# Patient Record
Sex: Male | Born: 1965 | Race: White | Hispanic: No | Marital: Single | Smoking: Current every day smoker
Health system: Southern US, Community
[De-identification: ages and names within clinical notes are randomized; demographics above are authoritative.]

---

## 2014-12-09 ENCOUNTER — Encounter (HOSPITAL_BASED_OUTPATIENT_CLINIC_OR_DEPARTMENT_OTHER): Payer: Self-pay | Admitting: *Deleted

## 2014-12-09 ENCOUNTER — Emergency Department (HOSPITAL_BASED_OUTPATIENT_CLINIC_OR_DEPARTMENT_OTHER): Payer: Self-pay

## 2014-12-09 ENCOUNTER — Emergency Department (HOSPITAL_BASED_OUTPATIENT_CLINIC_OR_DEPARTMENT_OTHER)
Admission: EM | Admit: 2014-12-09 | Discharge: 2014-12-09 | Disposition: A | Discharge status: Home / Self Care | Payer: Self-pay | Attending: Emergency Medicine | Admitting: Emergency Medicine

## 2014-12-09 DIAGNOSIS — W11XXXA Fall on and from ladder, initial encounter: Secondary | ICD-10-CM | POA: Insufficient documentation

## 2014-12-09 DIAGNOSIS — S4992XA Unspecified injury of left shoulder and upper arm, initial encounter: Secondary | ICD-10-CM | POA: Insufficient documentation

## 2014-12-09 DIAGNOSIS — Y998 Other external cause status: Secondary | ICD-10-CM | POA: Insufficient documentation

## 2014-12-09 DIAGNOSIS — S62612A Displaced fracture of proximal phalanx of right middle finger, initial encounter for closed fracture: Secondary | ICD-10-CM | POA: Insufficient documentation

## 2014-12-09 DIAGNOSIS — Z720 Tobacco use: Secondary | ICD-10-CM | POA: Insufficient documentation

## 2014-12-09 DIAGNOSIS — Y92094 Garage of other non-institutional residence as the place of occurrence of the external cause: Secondary | ICD-10-CM | POA: Insufficient documentation

## 2014-12-09 DIAGNOSIS — S62609A Fracture of unspecified phalanx of unspecified finger, initial encounter for closed fracture: Secondary | ICD-10-CM

## 2014-12-09 DIAGNOSIS — M79643 Pain in unspecified hand: Secondary | ICD-10-CM

## 2014-12-09 DIAGNOSIS — S59902A Unspecified injury of left elbow, initial encounter: Secondary | ICD-10-CM | POA: Insufficient documentation

## 2014-12-09 DIAGNOSIS — Y9389 Activity, other specified: Secondary | ICD-10-CM | POA: Insufficient documentation

## 2014-12-09 DIAGNOSIS — M25522 Pain in left elbow: Secondary | ICD-10-CM

## 2014-12-09 DIAGNOSIS — M25512 Pain in left shoulder: Secondary | ICD-10-CM

## 2014-12-09 MED ORDER — HYDROCODONE-ACETAMINOPHEN 5-325 MG PO TABS: 1.0000 | ORAL_TABLET | Freq: Four times a day (QID) | ORAL | Status: DC | PRN

## 2014-12-09 MED ORDER — HYDROMORPHONE HCL 1 MG/ML IJ SOLN
1.0000 mg | Freq: Once | INTRAMUSCULAR | Status: AC
  Administered 2014-12-09: 1 mg via INTRAVENOUS
  Filled 2014-12-09: qty 1

## 2014-12-09 MED ORDER — ONDANSETRON HCL 4 MG/2ML IJ SOLN
4.0000 mg | Freq: Once | INTRAMUSCULAR | Status: AC
  Administered 2014-12-09: 4 mg via INTRAVENOUS
  Filled 2014-12-09: qty 2

## 2014-12-09 MED ORDER — KETOROLAC TROMETHAMINE 30 MG/ML IJ SOLN
30.0000 mg | Freq: Once | INTRAMUSCULAR | Status: AC
  Administered 2014-12-09: 30 mg via INTRAVENOUS
  Filled 2014-12-09: qty 1

## 2014-12-09 MED ORDER — TRAMADOL HCL 50 MG PO TABS: 50.0000 mg | ORAL_TABLET | Freq: Four times a day (QID) | ORAL | Status: AC | PRN

## 2014-12-09 MED ORDER — IBUPROFEN 600 MG PO TABS: 600.0000 mg | ORAL_TABLET | Freq: Three times a day (TID) | ORAL | Status: AC | PRN

## 2014-12-09 NOTE — ED Notes (Signed)
Pt reports that he fell off a ladder while working.  Reports that his right hand was crushed in a garage door motor.  Reports (L) shoulder, left rib, left elbow pain.  Obvious left shoulder deformity.  Pulses and sensation present.  Pt in obvious pain.

## 2014-12-09 NOTE — ED Notes (Signed)
Pt stated he just remembered that Vicodin gave him a rash but he can take Percocet. Dr. Patria Maneampos notified and prescription for Tramadol given. Pt states Tramadol will not help him.

## 2014-12-09 NOTE — ED Provider Notes (Signed)
CSN: [id number]     Arrival date & time 12/09/14  1317 History   First MD Initiated Contact with Patient 12/09/14 1337     Chief Complaint  Patient presents with  . Fall     The history is provided by the patient.   Patient was working on a ladder on a garage door when the motor component of the garage door fell and pinned his right hand between the motor and the latter.  This causes exquisite pain and when he pulled his right hand out he fell off a ladder onto his left shoulder and presents with severe left shoulder and left elbow pain.  He denies head injury.  Denies neck pain or back pain.  Denies weakness of his upper lower extremities.  He reports pain to the MCP joint of his right middle finger.  He also reports discomfort and pain in his left shoulder and his left elbow.  He denies numbness or weakness of his hands.  No abdominal pain.  Pain is moderate in severity.  Pain is worsened by movement and palpation of his injured extremities.  Denies headache.  No use of anticoagulants.   History reviewed. No pertinent past medical history. History reviewed. No pertinent past surgical history. History reviewed. No pertinent family history. History  Substance Use Topics  . Smoking status: Current Every Day Smoker -- 1.00 packs/day    Types: Cigarettes  . Smokeless tobacco: Not on file  . Alcohol Use: No    Review of Systems  All other systems reviewed and are negative.     Allergies  Review of patient's allergies indicates no known allergies.  Home Medications   Prior to Admission medications   Medication Sig Start Date End Date Taking? Authorizing Provider  HYDROcodone-acetaminophen (NORCO/VICODIN) 5-325 MG per tablet Take 1 tablet by mouth every 6 (six) hours as needed for moderate pain. 12/09/14   Azalia BilisKevin Angelita Harnack, MD  ibuprofen (ADVIL,MOTRIN) 600 MG tablet Take 1 tablet (600 mg total) by mouth every 8 (eight) hours as needed. 12/09/14   Azalia BilisKevin Broady Lafoy, MD   BP 145/111 mmHg  Pulse  102  Temp(Src) 97.8 F (36.6 C) (Oral)  Resp 22  Ht 6\' 2"  (1.88 m)  Wt 280 lb (127.007 kg)  BMI 35.93 kg/m2  SpO2 98% Physical Exam  Constitutional: He is oriented to person, place, and time. He appears well-developed and well-nourished.  HENT:  Head: Normocephalic and atraumatic.  Eyes: EOM are normal.  Neck: Normal range of motion.  Cardiovascular: Normal rate, regular rhythm, normal heart sounds and intact distal pulses.   Pulmonary/Chest: Effort normal and breath sounds normal. No respiratory distress. He exhibits no tenderness.  Abdominal: Soft. He exhibits no distension. There is no tenderness.  Musculoskeletal: Normal range of motion.  Patient with tenderness and mild swelling of his MCP joint of his right third finger.  Compartments of his right hand are soft.  Full range of motion of right wrist, right elbow, right shoulder.  Patient with mild tenderness of his left before meals joint without obvious deformity.  Some pain with range of motion of his left shoulder.  Patient also with tenderness in palpation along his olecranon of his left.  Full range of motion of left hand and left wrist.  Normal left radial pulse.  Left upper extremities perfused.  Neurological: He is alert and oriented to person, place, and time.  Skin: Skin is warm and dry.  Psychiatric: He has a normal mood and affect. Judgment normal.  Nursing note and vitals reviewed.   ED Course  Procedures (including critical care time) Labs Review Labs Reviewed - No data to display  Imaging Review Dg Elbow Complete Left  12/09/2014   CLINICAL DATA:  Larey Seat off a ladder today.  Left elbow pain.  EXAM: LEFT ELBOW - COMPLETE 3+ VIEW  COMPARISON:  None.  FINDINGS: The joint spaces are maintained. No acute bony findings, significant degenerative changes or joint effusion.  IMPRESSION: No acute fracture.   Electronically Signed   By: Rudie Meyer M.D.   On: 12/09/2014 14:16   Dg Shoulder Left  12/09/2014   CLINICAL DATA:   Pain after fall from ladder  EXAM: LEFT SHOULDER - 2+ VIEW  COMPARISON:  None.  FINDINGS: Frontal and Y scapular images were obtained. There is evidence of old trauma involving the distal clavicle with what appears to be chronic widening of the acromioclavicular joint. There is also widening of the coracoclavicular joint consistent with a degree of coracoclavicular separation, age uncertain. No acute fracture or dislocation. No appreciable joint space narrowing.  IMPRESSION: Old trauma involving the distal right clavicle with widening of the acromioclavicular joint. Age uncertain coracoclavicular separation is apparent. No acute fracture or frank dislocation.   Electronically Signed   By: Bretta Bang III M.D.   On: 12/09/2014 14:16   Dg Hand Complete Right  12/09/2014   CLINICAL DATA:  Larey Seat off ladder this morning with right hand pain  EXAM: RIGHT HAND - COMPLETE 3+ VIEW  COMPARISON:  None.  FINDINGS: There is a chip fracture off of the ulnar side base of the third proximal phalanx. No other focal abnormalities.  IMPRESSION: Fracture third proximal phalanx   Electronically Signed   By: Esperanza Heir M.D.   On: 12/09/2014 14:17  I personally reviewed the imaging tests through PACS system I reviewed available ER/hospitalization records through the EMR    EKG Interpretation None      MDM   Final diagnoses:  Hand pain  Left shoulder pain  Left elbow pain  Finger fracture, left, closed, initial encounter    Possible before meals joint separation.  His pain is localized to this area.  Small punctate proximal phalanx fracture.  Treated with buddy taping.  Orthopedic follow-up for his left shoulder if his pain is left shoulder does not improve.  Home with a sling and buddy taping.  Home with hydrocodone.  Repeat abdominal exam benign.  Compartments of right hand are soft.  Patient reports that the motor weight approximately 30 pounds.    Azalia Bilis, MD 12/09/14 1515

## 2014-12-09 NOTE — Discharge Instructions (Signed)
Buddy Taping You have a minor finger or toe injury. It can be managed by buddy taping. Buddy taping means the injured finger or toe is taped to a healthy uninjured adjacent finger or toe. Most minor fractures and dislocations of the smaller fingers and toes will heal in 3 to 4 weeks. Buddy taping immobilizes and protects the area of injury. Buddy taping is not recommended for initial treatment of fractures of the thumb, longer fingers, or the great toe. Buddy taping should not be used for unstable or deformed fractures, but as fracture healing progresses it may be used for protection during rehabilitation. Fractured fingers and toes should be protected by buddy taping as long as the injury is still painful or swollen.  When an injury is buddy taped, place a small piece of gauze or cotton between the digits that are taped. This helps prevent the skin from breaking down from increased moisture. Buddy taping allows you to get your injury wet when you bathe. Change the gauze and tape more often if it gets wet, and dry the space between the finger or toes. Use a sturdy, hard-soled shoe for better support if you have a fractured toe. In 2 to 3 weeks you can start motion exercises. This will keep the fingers or toes from becoming stiff.  SEEK IMMEDIATE MEDICAL CARE IF:   The injured area becomes cold, numb, or pale.  You have pain not controlled with medications.  You notice increasing deformity of the toe or finger. Document Released: 08/12/2004 Document Revised: 09/27/2011 Document Reviewed: 12/11/2008 [facility] Patient Information 2015 [place], [place]. This information is not intended to replace advice given to you by your health care provider. Make sure you discuss any questions you have with your health care provider. Acromioclavicular Injuries The AC (acromioclavicular) joint is the joint in the shoulder where the collarbone (clavicle) meets the shoulder blade (scapula). The part of the shoulder blade  connected to the collarbone is called the acromion. Common problems with and treatments for the [facility] joint are detailed below. ARTHRITIS Arthritis occurs when the joint has been injured and the smooth padding between the joints (cartilage) is lost. This is the wear and tear seen in most joints of the body if they have been overused. This causes the joint to produce pain and swelling which is worse with activity.  AC JOINT SEPARATION AC joint separation means that the ligaments connecting the acromion of the shoulder blade and collarbone have been damaged, and the two bones no longer line up. AC separations can be anywhere from mild to severe, and are "graded" depending upon which ligaments are torn and how badly they are torn.  Grade I Injury: the least damage is done, and the [facility] joint still lines up.  Grade II Injury: damage to the ligaments which reinforce the [facility] joint. In a Grade II injury, these ligaments are stretched but not entirely torn. When stressed, the [facility] joint becomes painful and unstable.  Grade III Injury: AC and secondary ligaments are completely torn, and the collarbone is no longer attached to the shoulder blade. This results in deformity; a prominence of the end of the clavicle. AC JOINT FRACTURE AC joint fracture means that there has been a break in the bones of the [facility] joint, usually the end of the clavicle. TREATMENT TREATMENT OF AC ARTHRITIS  There is currently no way to replace the cartilage damaged by arthritis. The best way to improve the condition is to decrease the activities which aggravate the problem.  Application of ice to the joint helps decrease pain and soreness (inflammation). The use of non-steroidal anti-inflammatory medication is helpful.  If less conservative measures do not work, then cortisone shots (injections) may be used. These are anti-inflammatories; they decrease the soreness in the joint and swelling.  If non-surgical measures fail, surgery may be  recommended. The procedure is generally removal of a portion of the end of the clavicle. This is the part of the collarbone closest to your acromion which is stabilized with ligaments to the acromion of the shoulder blade. This surgery may be performed using a tube-like instrument with a light (arthroscope) for looking into a joint. It may also be performed as an open surgery through a small incision by the surgeon. Most patients will have good range of motion within 6 weeks and may return to all activity including sports by 8-12 weeks, barring complications. TREATMENT OF AN AC SEPARATION  The initial treatment is to decrease pain. This is best accomplished by immobilizing the arm in a sling and placing an ice pack to the shoulder for 20 to 30 minutes every 2 hours as needed. As the pain starts to subside, it is important to begin moving the fingers, wrist, elbow and eventually the shoulder in order to prevent a stiff or "frozen" shoulder. Instruction on when and how much to move the shoulder will be provided by your caregiver. The length of time needed to regain full motion and function depends on the amount or grade of the injury. Recovery from a Grade I AC separation usually takes 10 to 14 days, whereas a Grade III may take 6 to 8 weeks.  Grade I and II separations usually do not require surgery. Even Grade III injuries usually allow return to full activity with few restrictions. Treatment is also based on the activity demands of the injured shoulder. For example, a high level quarterback with an injured throwing arm will receive more aggressive treatment than someone with a desk job who rarely uses his/her arm for strenuous activities. In some cases, a painful lump may persist which could require a later surgery. Surgery can be very successful, but the benefits must be weighed against the potential risks. TREATMENT OF AN AC JOINT FRACTURE Fracture treatment depends on the type of fracture. Sometimes a  splint or sling may be all that is required. Other times surgery may be required for repair. This is more frequently the case when the ligaments supporting the clavicle are completely torn. Your caregiver will help you with these decisions and together you can decide what will be the best treatment. HOME CARE INSTRUCTIONS   Apply ice to the injury for 15-20 minutes each hour while awake for 2 days. Put the ice in a plastic bag and place a towel between the bag of ice and skin.  If a sling has been applied, wear it constantly for as long as directed by your caregiver, even at night. The sling or splint can be removed for bathing or showering or as directed. Be sure to keep the shoulder in the same place as when the sling is on. Do not lift the arm.  If a figure-of-eight splint has been applied it should be tightened gently by another person every day. Tighten it enough to keep the shoulders held back. Allow enough room to place the index finger between the body and strap. Loosen the splint immediately if there is numbness or tingling in the hands.  Take over-the-counter or prescription medicines for pain,  discomfort or fever as directed by your caregiver.  If you or your child has received a follow up appointment, it is very important to keep that appointment in order to avoid long term complications, chronic pain or disability. SEEK MEDICAL CARE IF:   The pain is not relieved with medications.  There is increased swelling or discoloration that continues to get worse rather than better.  You or your child has been unable to follow up as instructed.  There is progressive numbness and tingling in the arm, forearm or hand. SEEK IMMEDIATE MEDICAL CARE IF:   The arm is numb, cold or pale.  There is increasing pain in the hand, forearm or fingers. MAKE SURE YOU:   Understand these instructions.  Will watch your condition.  Will get help right away if you are not doing well or get  worse. Document Released: 04/14/2005 Document Revised: 09/27/2011 Document Reviewed: 10/07/2008 [facility] Patient Information 2015 [place], [place]. This information is not intended to replace advice given to you by your health care provider. Make sure you discuss any questions you have with your health care provider.

## 2014-12-09 NOTE — ED Notes (Signed)
Pt resting but requesting more pain medication

## 2015-12-22 IMAGING — DX DG SHOULDER 2+V*L*
2 series · 2 of 2 positions shown · non-contrast
Comparison: None.

CLINICAL DATA: Pain after fall from ladder

EXAM:
LEFT SHOULDER - 2+ VIEW

[shoulder grashey]
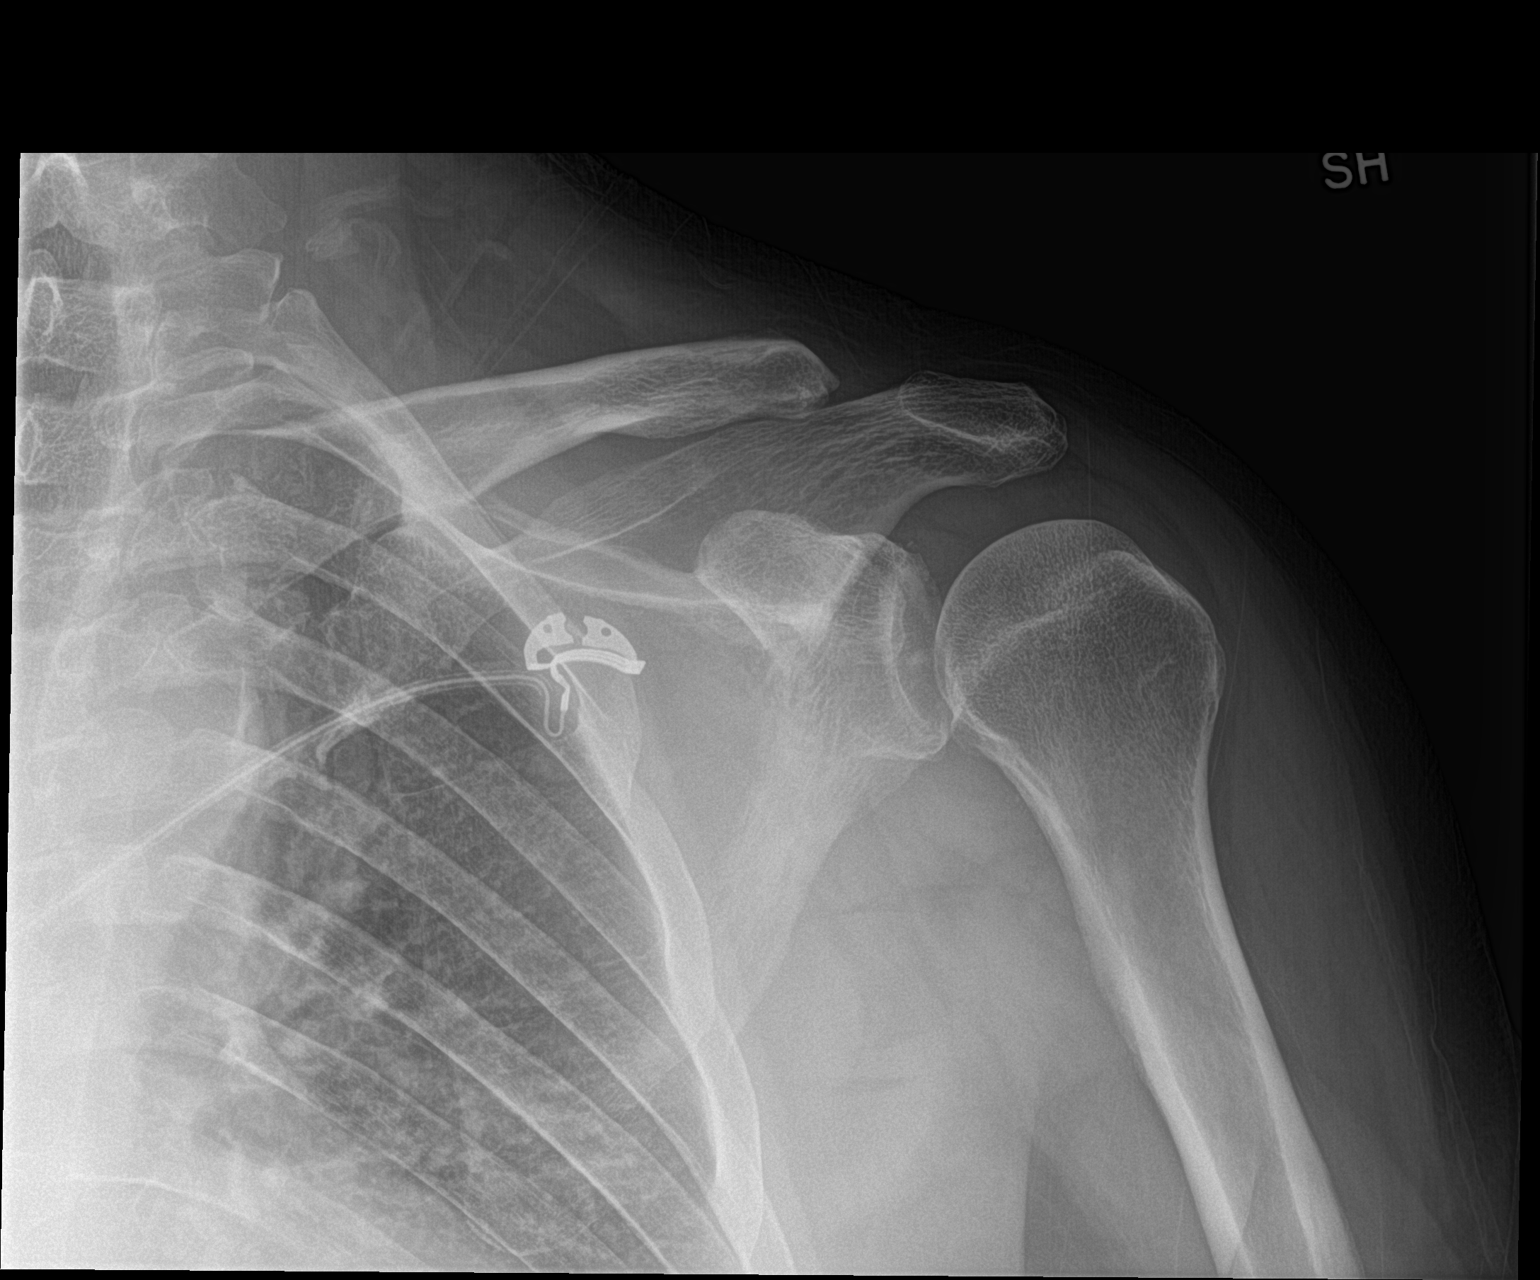

[shoulder y view]
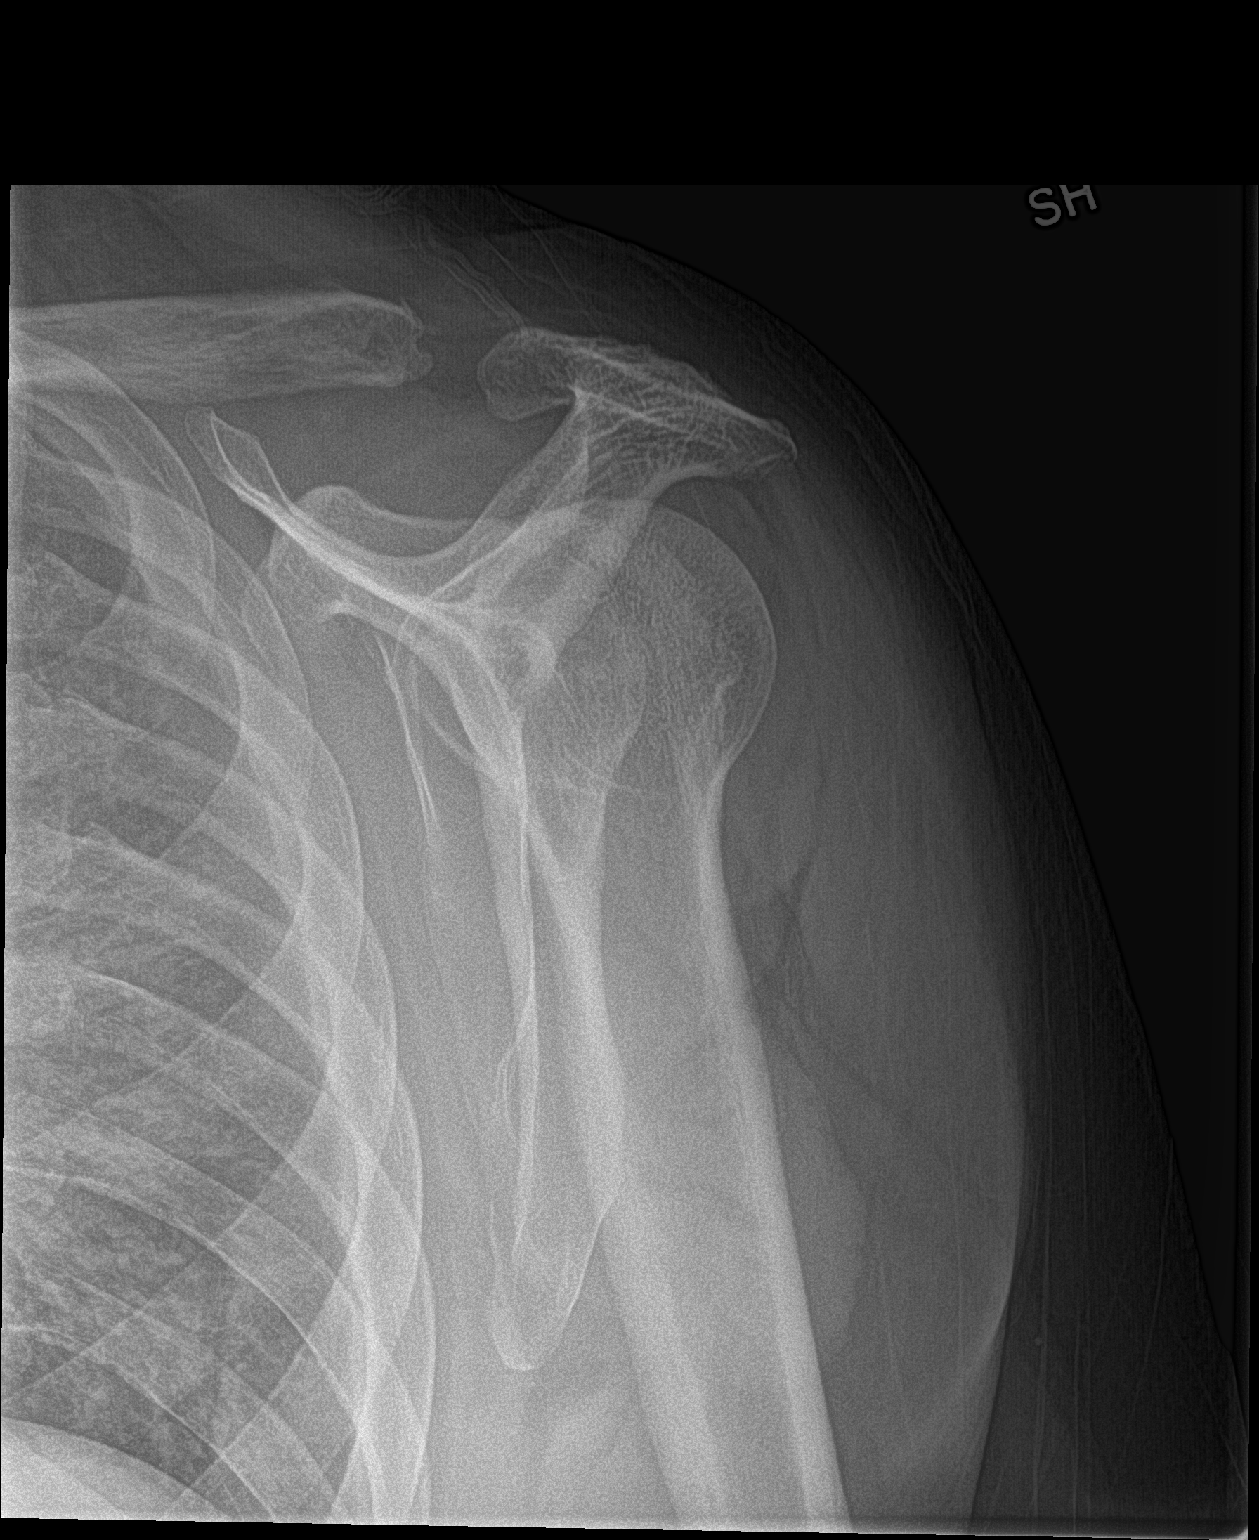

[2 of 2 positions shown; findings below may reference images not displayed]

FINDINGS: Frontal and Y scapular images were obtained. There is evidence of
old trauma involving the distal clavicle with what appears to be
chronic widening of the acromioclavicular joint. There is also
widening of the coracoclavicular joint consistent with a degree of
coracoclavicular separation, age uncertain. No acute fracture or
dislocation. No appreciable joint space narrowing.
IMPRESSION: Old trauma involving the distal right clavicle with widening of the
acromioclavicular joint. Age uncertain coracoclavicular separation
is apparent. No acute fracture or frank dislocation.
